# Patient Record
Sex: Male | Born: 1960 | Race: White | Hispanic: No | Marital: Married | Smoking: Never smoker
Health system: Southern US, Community
[De-identification: ages and names within clinical notes are randomized; demographics above are authoritative.]

## PROBLEM LIST (undated history)

## (undated) DIAGNOSIS — K219 Gastro-esophageal reflux disease without esophagitis: Secondary | ICD-10-CM

## (undated) HISTORY — PX: FINGER SURGERY: SHX640

---

## 2006-06-18 ENCOUNTER — Encounter: Payer: Self-pay | Admitting: *Deleted

## 2006-06-21 ENCOUNTER — Encounter: Admission: RE | Admit: 2006-06-21 | Discharge: 2006-06-21 | Payer: Self-pay | Admitting: *Deleted

## 2010-02-26 ENCOUNTER — Encounter: Payer: Self-pay | Admitting: *Deleted

## 2012-10-30 ENCOUNTER — Other Ambulatory Visit: Payer: Self-pay | Admitting: Neurosurgery

## 2012-10-30 DIAGNOSIS — M47816 Spondylosis without myelopathy or radiculopathy, lumbar region: Secondary | ICD-10-CM

## 2012-11-03 ENCOUNTER — Ambulatory Visit
Admission: RE | Admit: 2012-11-03 | Discharge: 2012-11-03 | Disposition: A | Discharge status: Home / Self Care | Payer: Worker's Compensation | Source: Ambulatory Visit | Attending: Neurosurgery | Admitting: Neurosurgery

## 2012-11-03 DIAGNOSIS — M47816 Spondylosis without myelopathy or radiculopathy, lumbar region: Secondary | ICD-10-CM

## 2012-11-03 MED ORDER — METHYLPREDNISOLONE ACETATE 40 MG/ML INJ SUSP (RADIOLOG
120.0000 mg | Freq: Once | INTRAMUSCULAR | Status: AC
  Administered 2012-11-03: 120 mg via EPIDURAL

## 2012-11-03 MED ORDER — IOHEXOL 180 MG/ML  SOLN
1.0000 mL | Freq: Once | INTRAMUSCULAR | Status: AC | PRN
  Administered 2012-11-03: 1 mL via EPIDURAL

## 2012-12-02 ENCOUNTER — Other Ambulatory Visit: Payer: Self-pay | Admitting: Neurosurgery

## 2012-12-02 DIAGNOSIS — M47816 Spondylosis without myelopathy or radiculopathy, lumbar region: Secondary | ICD-10-CM

## 2012-12-15 ENCOUNTER — Ambulatory Visit
Admission: RE | Admit: 2012-12-15 | Discharge: 2012-12-15 | Disposition: A | Discharge status: Home / Self Care | Payer: Worker's Compensation | Source: Ambulatory Visit | Attending: Neurosurgery | Admitting: Neurosurgery

## 2012-12-15 VITALS — BP 132/79 | HR 60

## 2012-12-15 DIAGNOSIS — M47816 Spondylosis without myelopathy or radiculopathy, lumbar region: Secondary | ICD-10-CM

## 2012-12-15 DIAGNOSIS — M5126 Other intervertebral disc displacement, lumbar region: Secondary | ICD-10-CM

## 2012-12-15 MED ORDER — METHYLPREDNISOLONE ACETATE 40 MG/ML INJ SUSP (RADIOLOG: 120.0000 mg | Freq: Once | INTRAMUSCULAR | Status: DC

## 2012-12-15 MED ORDER — IOHEXOL 180 MG/ML  SOLN: 1.0000 mL | Freq: Once | INTRAMUSCULAR | Status: AC | PRN

## 2015-10-26 ENCOUNTER — Other Ambulatory Visit (INDEPENDENT_AMBULATORY_CARE_PROVIDER_SITE_OTHER): Payer: Self-pay | Admitting: Internal Medicine

## 2015-10-26 DIAGNOSIS — R1310 Dysphagia, unspecified: Secondary | ICD-10-CM

## 2015-10-27 ENCOUNTER — Encounter (HOSPITAL_COMMUNITY): Payer: Self-pay | Admitting: *Deleted

## 2015-10-27 ENCOUNTER — Encounter (HOSPITAL_COMMUNITY)
Admission: RE | Disposition: A | Discharge status: Home / Self Care | Payer: Self-pay | Source: Ambulatory Visit | Attending: Internal Medicine

## 2015-10-27 ENCOUNTER — Ambulatory Visit (HOSPITAL_COMMUNITY)
Admission: RE | Admit: 2015-10-27 | Discharge: 2015-10-27 | Disposition: A | Discharge status: Home / Self Care | Payer: BLUE CROSS/BLUE SHIELD | Source: Ambulatory Visit | Attending: Internal Medicine | Admitting: Internal Medicine

## 2015-10-27 DIAGNOSIS — K449 Diaphragmatic hernia without obstruction or gangrene: Secondary | ICD-10-CM | POA: Diagnosis not present

## 2015-10-27 DIAGNOSIS — K2960 Other gastritis without bleeding: Secondary | ICD-10-CM | POA: Diagnosis not present

## 2015-10-27 DIAGNOSIS — K2970 Gastritis, unspecified, without bleeding: Secondary | ICD-10-CM | POA: Insufficient documentation

## 2015-10-27 DIAGNOSIS — K3189 Other diseases of stomach and duodenum: Secondary | ICD-10-CM | POA: Diagnosis not present

## 2015-10-27 DIAGNOSIS — R1314 Dysphagia, pharyngoesophageal phase: Secondary | ICD-10-CM

## 2015-10-27 DIAGNOSIS — K219 Gastro-esophageal reflux disease without esophagitis: Secondary | ICD-10-CM | POA: Diagnosis not present

## 2015-10-27 DIAGNOSIS — R1310 Dysphagia, unspecified: Secondary | ICD-10-CM | POA: Diagnosis not present

## 2015-10-27 HISTORY — PX: FOREIGN BODY REMOVAL: SHX962

## 2015-10-27 HISTORY — PX: ESOPHAGOGASTRODUODENOSCOPY: SHX5428

## 2015-10-27 HISTORY — DX: Gastro-esophageal reflux disease without esophagitis: K21.9

## 2015-10-27 HISTORY — PX: ESOPHAGEAL DILATION: SHX303

## 2015-10-27 SURGERY — EGD (ESOPHAGOGASTRODUODENOSCOPY)
Anesthesia: Moderate Sedation

## 2015-10-27 MED ORDER — MIDAZOLAM HCL 5 MG/5ML IJ SOLN
INTRAMUSCULAR | Status: AC
  Filled 2015-10-27: qty 10

## 2015-10-27 MED ORDER — MEPERIDINE HCL 50 MG/ML IJ SOLN
INTRAMUSCULAR | Status: AC
  Filled 2015-10-27: qty 1

## 2015-10-27 MED ORDER — MIDAZOLAM HCL 5 MG/5ML IJ SOLN
INTRAMUSCULAR | Status: DC | PRN
  Administered 2015-10-27 (×3): 2 mg via INTRAVENOUS
  Administered 2015-10-27: 1 mg via INTRAVENOUS
  Administered 2015-10-27: 2 mg via INTRAVENOUS
  Administered 2015-10-27: 1 mg via INTRAVENOUS

## 2015-10-27 MED ORDER — SODIUM CHLORIDE 0.9 % IV SOLN
INTRAVENOUS | Status: DC
  Administered 2015-10-27: 1000 mL via INTRAVENOUS

## 2015-10-27 MED ORDER — MEPERIDINE HCL 50 MG/ML IJ SOLN
INTRAMUSCULAR | Status: DC | PRN
  Administered 2015-10-27 (×2): 25 mg via INTRAVENOUS

## 2015-10-27 NOTE — H&P (Signed)
Calvin Delacruz is an 55 y.o. male.   Chief Complaint: Patient is here for EGD and possible ED. HPI: She is [age] old Caucasian male was at symptoms of GERD for more than 15 years who had difficulty swallowing and is eating up yesterday. It got stuck in suprasternal region. States she was finally able to wash it down. He has occasional difficulty with meats and breads. He says his symptoms started with lump in his throat. He was seen by ENT specialist and treated with Nexium 40 twice a day for several months and then he was taking once a day lately she's been using an on demand. He watches his diet. He denies weight loss melena or abdominal pain.  Past Medical History:  Diagnosis Date  . GERD (gastroesophageal reflux disease)     Past Surgical History:  Procedure Laterality Date  . FINGER SURGERY Right    and arm    Family History  Problem Relation Age of Onset  . GER disease Mother   . Heart attack Father   . GER disease Sister   . GER disease Brother    Social History:  reports that he has never smoked. He has never used smokeless tobacco. He reports that he drinks alcohol. He reports that he does not use drugs.  Allergies: No Known Allergies  Medications Prior to Admission  Medication Sig Dispense Refill  . esomeprazole (NEXIUM) 20 MG capsule Take 20 mg by mouth daily at 12 noon.      No results found for this or any previous visit (from the past 48 hour(s)). No results found.  ROS  Blood pressure (!) 143/88, pulse 66, temperature 98.4 F (36.9 C), temperature source Oral, resp. rate 19, height 5\' 6"  (1.676 m), weight 170 lb (77.1 kg), SpO2 100 %. Physical Exam  Constitutional: He appears well-developed and well-nourished.  HENT:  Mouth/Throat: Oropharynx is clear and moist.  Eyes: Conjunctivae are normal. No scleral icterus.  Neck: No thyromegaly present.  Cardiovascular: Normal rate, regular rhythm and normal heart sounds.   No murmur heard. Respiratory: Effort normal  and breath sounds normal.  GI: Soft. He exhibits no distension and no mass. There is no tenderness.  Musculoskeletal: He exhibits no edema.  Lymphadenopathy:    He has no cervical adenopathy.  Neurological: He is alert.  Skin: Skin is warm and dry.     Assessment/Plan Chronic GERD. Solid food dysphagia. EGD possible ED.  Lionel DecemberNajeeb Kenadie Royce, MD 10/27/2015, 10:59 AM

## 2015-10-27 NOTE — Op Note (Addendum)
[facility] Patient Name: Calvin Delacruz Procedure Date: 10/27/2015 9:06 AM MRN: [id number] Date of Birth: 1960/05/18 Attending MD: Lionel December , MD CSN: [id number] Age: 55 Admit Type: Outpatient Procedure:                Upper GI endoscopy Indications:              Esophageal dysphagia, Gastro-esophageal reflux                            disease Providers:                Lionel December, MD, Nena Polio, RN Referring MD:             Primary care physician: Estanislado Pandy, MD Medicines:                Cetacaine spray, Meperidine 50 mg IV, Midazolam 10                            mg IV Complications:            No immediate complications. Estimated Blood Loss:     Estimated blood loss was minimal. Procedure:                Pre-Anesthesia Assessment:                           - Prior to the procedure, a History and Physical                            was performed, and patient medications and                            allergies were reviewed. The patient's tolerance of                            previous anesthesia was also reviewed. The risks                            and benefits of the procedure and the sedation                            options and risks were discussed with the patient.                            All questions were answered, and informed consent                            was obtained. Prior Anticoagulants: The patient has                            taken no previous anticoagulant or antiplatelet                            agents. ASA Grade Assessment: I - A normal, healthy  patient. After reviewing the risks and benefits,                            the patient was deemed in satisfactory condition to                            undergo the procedure.                           After obtaining informed consent, the endoscope was                            passed under direct vision. Throughout the                            procedure, the  patient's blood pressure, pulse, and                            oxygen saturations were monitored continuously. The                            EG-[id number] ([id number]) scope was introduced through the                            and advanced to the second part of duodenum. The                            upper GI endoscopy was accomplished without                            difficulty. The patient tolerated the procedure                            well. Scope In: 11:14:29 AM Scope Out: 11:31:26 AM Total Procedure Duration: 0 hours 16 minutes 57 seconds  Findings:      The examined esophagus was normal.      The Z-line was regular and was found 40 cm from the incisors.      A 2 cm hiatal hernia was present.      No endoscopic abnormality was evident in the esophagus to explain the       patient's complaint of dysphagia. It was decided, however, to proceed       with dilation of the entire esophagus. The dilation site was examined       following endoscope reinsertion and showed no change and no bleeding,       mucosal tear or perforation.      A few localized, non-bleeding erosions were found in the prepyloric       region of the stomach. There were no stigmata of recent bleeding.       Biopsies were taken with a cold forceps for histology.      The exam of the stomach was otherwise normal.      A small amount of food (residue) was found in the gastric body.      The duodenal bulb and second portion of the duodenum were normal. Impression:               -  Normal esophagus.                           - Z-line regular, 40 cm from the incisors.                           - 2 cm hiatal hernia.                           - No endoscopic esophageal abnormality to explain                            patient's dysphagia. Esophagus dilated.                           - Non-bleeding erosive gastropathy. Biopsied.                           - A small amount of food (residue) in the stomach.                            - Normal duodenal bulb and second portion of the                            duodenum. Moderate Sedation:      Moderate (conscious) sedation was administered by the endoscopy nurse       and supervised by the endoscopist. The following parameters were       monitored: oxygen saturation, heart rate, blood pressure, CO2       capnography and response to care. Total physician intraservice time was       25 minutes. Recommendation:           - Patient has a contact number available for                            emergencies. The signs and symptoms of potential                            delayed complications were discussed with the                            patient. Return to normal activities tomorrow.                            Written discharge instructions were provided to the                            patient.                           - Resume previous diet today.                           - Continue present medications.                           -  Await pathology results. Procedure Code(s):        --- Professional ---                           [phone], Esophagogastroduodenoscopy, flexible,                            transoral; with biopsy, single or multiple                           99152, Moderate sedation services provided by the                            same physician or other qualified health care                            professional performing the diagnostic or                            therapeutic service that the sedation supports,                            requiring the presence of an independent trained                            observer to assist in the monitoring of the                            patient's level of consciousness and physiological                            status; initial 15 minutes of intraservice time,                            patient age 63 years or older                           [phone], Moderate sedation services; each additional                             15 minutes intraservice time Diagnosis Code(s):        --- Professional ---                           K44.9, Diaphragmatic hernia without obstruction or                            gangrene                           K31.89, Other diseases of stomach and duodenum                           R13.14, Dysphagia, pharyngoesophageal phase  K21.9, Gastro-esophageal reflux disease without                            esophagitis CPT copyright 2016 American Medical Association. All rights reserved. The codes documented in this report are preliminary and upon coder review may  be revised to meet current compliance requirements. Lionel December, MD Lionel December, MD 10/27/2015 11:46:51 AM This report has been signed electronically. Number of Addenda: 1 Addendum Number: 1   Addendum Date: 11/07/2015 10:27:17 AM      Esophagus was dilated by passing 54 Fr. Maloney dilator.      Esophagus wa and no mucosal disruption noted. Lionel December, MD Lionel December, MD 11/07/2015 10:29:11 AM This report has been signed electronically.

## 2015-10-27 NOTE — Discharge Instructions (Signed)
Resume usual medications and diet. No driving for 24 hours. Physician will call with biopsy results.  Esophagogastroduodenoscopy, Care After Refer to this sheet in the next few weeks. These instructions provide you with information about caring for yourself after your procedure. Your health care provider may also give you more specific instructions. Your treatment has been planned according to current medical practices, but problems sometimes occur. Call your health care provider if you have any problems or questions after your procedure. Dr Karilyn Cotaehman [phone] WHAT TO EXPECT AFTER THE PROCEDURE After your procedure, it is typical to feel:  Soreness in your throat.  Pain with swallowing.  Sick to your stomach (nauseous).  Bloated.  Dizzy.  Fatigued. HOME CARE INSTRUCTIONS  Do not eat or drink anything until the numbing medicine (local anesthetic) has worn off and your gag reflex has returned. You will know that the local anesthetic has worn off when you can swallow comfortably.  Do not drive or operate machinery until directed by your health care provider.  Take medicines only as directed by your health care provider. SEEK MEDICAL CARE IF:   You cannot stop coughing.  You are not urinating at all or less than usual. SEEK IMMEDIATE MEDICAL CARE IF:  You have difficulty swallowing.  You cannot eat or drink.  You have worsening throat or chest pain.  You have dizziness or lightheadedness or you faint.  You have nausea or vomiting.  You have chills.  You have a fever.  You have severe abdominal pain.  You have black, tarry, or bloody stools.   This information is not intended to replace advice given to you by your health care provider. Make sure you discuss any questions you have with your health care provider.   Document Released: 01/09/2012 Document Revised: 02/12/2014 Document Reviewed: 01/09/2012 Elsevier Interactive Patient Education [employer]2016 Elsevier Inc.

## 2015-11-08 ENCOUNTER — Encounter (HOSPITAL_COMMUNITY): Payer: Self-pay | Admitting: Internal Medicine

## 2016-03-02 ENCOUNTER — Other Ambulatory Visit: Payer: Self-pay | Admitting: Neurosurgery

## 2016-03-02 DIAGNOSIS — M5412 Radiculopathy, cervical region: Secondary | ICD-10-CM

## 2016-03-04 ENCOUNTER — Ambulatory Visit
Admission: RE | Admit: 2016-03-04 | Discharge: 2016-03-04 | Disposition: A | Discharge status: Home / Self Care | Payer: Worker's Compensation | Source: Ambulatory Visit | Attending: Neurosurgery | Admitting: Neurosurgery

## 2016-03-04 DIAGNOSIS — M5412 Radiculopathy, cervical region: Secondary | ICD-10-CM

## 2016-03-05 ENCOUNTER — Other Ambulatory Visit: Payer: Self-pay | Admitting: Neurosurgery

## 2016-03-05 DIAGNOSIS — M5020 Other cervical disc displacement, unspecified cervical region: Secondary | ICD-10-CM

## 2016-03-06 ENCOUNTER — Ambulatory Visit
Admission: RE | Admit: 2016-03-06 | Discharge: 2016-03-06 | Disposition: A | Discharge status: Home / Self Care | Payer: Worker's Compensation | Source: Ambulatory Visit | Attending: Neurosurgery | Admitting: Neurosurgery

## 2016-03-06 DIAGNOSIS — M5020 Other cervical disc displacement, unspecified cervical region: Secondary | ICD-10-CM

## 2016-03-06 MED ORDER — TRIAMCINOLONE ACETONIDE 40 MG/ML IJ SUSP (RADIOLOGY)
60.0000 mg | Freq: Once | INTRAMUSCULAR | Status: AC
  Administered 2016-03-06: 60 mg via EPIDURAL

## 2016-03-06 MED ORDER — IOPAMIDOL (ISOVUE-M 300) INJECTION 61%
1.0000 mL | Freq: Once | INTRAMUSCULAR | Status: AC | PRN
  Administered 2016-03-06: 1 mL via EPIDURAL

## 2016-03-06 NOTE — Discharge Instructions (Signed)

## 2016-03-22 ENCOUNTER — Other Ambulatory Visit: Payer: Self-pay | Admitting: Neurosurgery

## 2016-03-22 DIAGNOSIS — M5020 Other cervical disc displacement, unspecified cervical region: Secondary | ICD-10-CM

## 2016-03-27 ENCOUNTER — Ambulatory Visit
Admission: RE | Admit: 2016-03-27 | Discharge: 2016-03-27 | Disposition: A | Discharge status: Home / Self Care | Payer: Worker's Compensation | Source: Ambulatory Visit | Attending: Neurosurgery | Admitting: Neurosurgery

## 2016-03-27 DIAGNOSIS — M5020 Other cervical disc displacement, unspecified cervical region: Secondary | ICD-10-CM

## 2016-03-27 MED ORDER — TRIAMCINOLONE ACETONIDE 40 MG/ML IJ SUSP (RADIOLOGY)
60.0000 mg | Freq: Once | INTRAMUSCULAR | Status: AC
  Administered 2016-03-27: 60 mg via EPIDURAL

## 2016-03-27 MED ORDER — IOPAMIDOL (ISOVUE-M 300) INJECTION 61%
1.0000 mL | Freq: Once | INTRAMUSCULAR | Status: AC | PRN
  Administered 2016-03-27: 1 mL via EPIDURAL

## 2016-04-11 ENCOUNTER — Other Ambulatory Visit (INDEPENDENT_AMBULATORY_CARE_PROVIDER_SITE_OTHER): Payer: Self-pay | Admitting: Internal Medicine

## 2016-04-11 DIAGNOSIS — R7989 Other specified abnormal findings of blood chemistry: Secondary | ICD-10-CM

## 2016-04-11 DIAGNOSIS — K759 Inflammatory liver disease, unspecified: Secondary | ICD-10-CM | POA: Diagnosis not present

## 2016-04-11 DIAGNOSIS — R51 Headache: Secondary | ICD-10-CM | POA: Diagnosis not present

## 2016-04-11 DIAGNOSIS — R112 Nausea with vomiting, unspecified: Secondary | ICD-10-CM

## 2016-04-11 DIAGNOSIS — R740 Nonspecific elevation of levels of transaminase and lactic acid dehydrogenase [LDH]: Secondary | ICD-10-CM | POA: Diagnosis not present

## 2016-04-11 DIAGNOSIS — R945 Abnormal results of liver function studies: Secondary | ICD-10-CM

## 2016-04-11 DIAGNOSIS — D72825 Bandemia: Secondary | ICD-10-CM | POA: Diagnosis not present

## 2016-04-13 ENCOUNTER — Other Ambulatory Visit (HOSPITAL_COMMUNITY)
Admission: RE | Admit: 2016-04-13 | Discharge: 2016-04-13 | Disposition: A | Discharge status: Home / Self Care | Payer: BLUE CROSS/BLUE SHIELD | Source: Ambulatory Visit | Attending: Internal Medicine | Admitting: Internal Medicine

## 2016-04-13 ENCOUNTER — Ambulatory Visit (HOSPITAL_COMMUNITY)
Admission: RE | Admit: 2016-04-13 | Discharge: 2016-04-13 | Disposition: A | Discharge status: Home / Self Care | Payer: BLUE CROSS/BLUE SHIELD | Source: Ambulatory Visit | Attending: Internal Medicine | Admitting: Internal Medicine

## 2016-04-13 DIAGNOSIS — R112 Nausea with vomiting, unspecified: Secondary | ICD-10-CM | POA: Insufficient documentation

## 2016-04-13 DIAGNOSIS — R7989 Other specified abnormal findings of blood chemistry: Secondary | ICD-10-CM | POA: Insufficient documentation

## 2016-04-13 DIAGNOSIS — R945 Abnormal results of liver function studies: Secondary | ICD-10-CM | POA: Diagnosis not present

## 2016-04-13 LAB — HEPATIC FUNCTION PANEL
ALK PHOS: 174 U/L — AB (ref 38–126)
ALT: 200 U/L — ABNORMAL HIGH (ref 17–63)
AST: 73 U/L — ABNORMAL HIGH (ref 15–41)
Albumin: 3.6 g/dL (ref 3.5–5.0)
BILIRUBIN INDIRECT: 1.5 mg/dL — AB (ref 0.3–0.9)
BILIRUBIN TOTAL: 2.1 mg/dL — AB (ref 0.3–1.2)
Bilirubin, Direct: 0.6 mg/dL — ABNORMAL HIGH (ref 0.1–0.5)
TOTAL PROTEIN: 7 g/dL (ref 6.5–8.1)

## 2016-04-14 LAB — CBC WITH DIFFERENTIAL/PLATELET
Basophils Absolute: 0 10*3/uL (ref 0.0–0.1)
Basophils Relative: 1 %
EOS PCT: 7 %
Eosinophils Absolute: 0.2 10*3/uL (ref 0.0–0.7)
HCT: 39.9 % (ref 39.0–52.0)
Hemoglobin: 13.7 g/dL (ref 13.0–17.0)
LYMPHS ABS: 0.8 10*3/uL (ref 0.7–4.0)
LYMPHS PCT: 22 %
MCH: 30.5 pg (ref 26.0–34.0)
MCHC: 34.3 g/dL (ref 30.0–36.0)
MCV: 88.9 fL (ref 78.0–100.0)
Monocytes Absolute: 0.3 10*3/uL (ref 0.1–1.0)
Monocytes Relative: 9 %
NEUTROS ABS: 2.2 10*3/uL (ref 1.7–7.7)
Neutrophils Relative %: 61 %
PLATELETS: 165 10*3/uL (ref 150–400)
RBC: 4.49 MIL/uL (ref 4.22–5.81)
RDW: 13.9 % (ref 11.5–15.5)
WBC: 3.6 10*3/uL — AB (ref 4.0–10.5)

## 2016-04-14 LAB — HEPATITIS B SURFACE ANTIBODY,QUALITATIVE: Hep B S Ab: NONREACTIVE

## 2016-04-14 LAB — HCV COMMENT:

## 2016-04-14 LAB — HEPATITIS A ANTIBODY, TOTAL: HEP A TOTAL AB: NEGATIVE

## 2016-04-14 LAB — HEPATITIS C ANTIBODY (REFLEX)

## 2016-04-14 LAB — HEPATITIS B CORE ANTIBODY, IGM: Hep B C IgM: NEGATIVE

## 2016-04-14 LAB — HEPATITIS B SURFACE ANTIGEN: Hepatitis B Surface Ag: NEGATIVE

## 2016-04-16 ENCOUNTER — Encounter (INDEPENDENT_AMBULATORY_CARE_PROVIDER_SITE_OTHER): Payer: Self-pay | Admitting: *Deleted

## 2016-04-16 ENCOUNTER — Other Ambulatory Visit (INDEPENDENT_AMBULATORY_CARE_PROVIDER_SITE_OTHER): Payer: Self-pay | Admitting: *Deleted

## 2016-04-16 DIAGNOSIS — D72819 Decreased white blood cell count, unspecified: Secondary | ICD-10-CM

## 2016-04-17 ENCOUNTER — Encounter (INDEPENDENT_AMBULATORY_CARE_PROVIDER_SITE_OTHER): Payer: Self-pay

## 2016-04-20 ENCOUNTER — Other Ambulatory Visit (INDEPENDENT_AMBULATORY_CARE_PROVIDER_SITE_OTHER): Payer: Self-pay | Admitting: *Deleted

## 2016-04-20 ENCOUNTER — Other Ambulatory Visit (HOSPITAL_COMMUNITY)
Admission: RE | Admit: 2016-04-20 | Discharge: 2016-04-20 | Disposition: A | Discharge status: Home / Self Care | Payer: BLUE CROSS/BLUE SHIELD | Source: Ambulatory Visit | Attending: Internal Medicine | Admitting: Internal Medicine

## 2016-04-20 DIAGNOSIS — R600 Localized edema: Secondary | ICD-10-CM

## 2016-04-20 DIAGNOSIS — R609 Edema, unspecified: Secondary | ICD-10-CM | POA: Insufficient documentation

## 2016-04-20 DIAGNOSIS — D72819 Decreased white blood cell count, unspecified: Secondary | ICD-10-CM

## 2016-04-20 LAB — CBC
HEMATOCRIT: 36 % — AB (ref 39.0–52.0)
Hemoglobin: 12.3 g/dL — ABNORMAL LOW (ref 13.0–17.0)
MCH: 30.4 pg (ref 26.0–34.0)
MCHC: 34.2 g/dL (ref 30.0–36.0)
MCV: 88.9 fL (ref 78.0–100.0)
PLATELETS: 263 10*3/uL (ref 150–400)
RBC: 4.05 MIL/uL — ABNORMAL LOW (ref 4.22–5.81)
RDW: 13.8 % (ref 11.5–15.5)
WBC: 5.6 10*3/uL (ref 4.0–10.5)

## 2016-04-20 LAB — HEPATIC FUNCTION PANEL
ALK PHOS: 118 U/L (ref 38–126)
ALT: 131 U/L — ABNORMAL HIGH (ref 17–63)
AST: 31 U/L (ref 15–41)
Albumin: 3.7 g/dL (ref 3.5–5.0)
BILIRUBIN INDIRECT: 0.5 mg/dL (ref 0.3–0.9)
BILIRUBIN TOTAL: 0.6 mg/dL (ref 0.3–1.2)
Bilirubin, Direct: 0.1 mg/dL (ref 0.1–0.5)
TOTAL PROTEIN: 6.5 g/dL (ref 6.5–8.1)

## 2016-04-20 LAB — MONONUCLEOSIS SCREEN: MONO SCREEN: NEGATIVE

## 2016-04-20 LAB — SEDIMENTATION RATE: Sed Rate: 8 mm/hr (ref 0–16)

## 2016-04-23 ENCOUNTER — Other Ambulatory Visit (INDEPENDENT_AMBULATORY_CARE_PROVIDER_SITE_OTHER): Payer: Self-pay | Admitting: *Deleted

## 2016-04-23 DIAGNOSIS — D72819 Decreased white blood cell count, unspecified: Secondary | ICD-10-CM

## 2016-04-24 DIAGNOSIS — R7989 Other specified abnormal findings of blood chemistry: Secondary | ICD-10-CM | POA: Diagnosis not present

## 2016-04-24 DIAGNOSIS — Z6828 Body mass index (BMI) 28.0-28.9, adult: Secondary | ICD-10-CM | POA: Diagnosis not present

## 2016-04-24 DIAGNOSIS — R600 Localized edema: Secondary | ICD-10-CM | POA: Diagnosis not present

## 2016-04-27 DIAGNOSIS — R0602 Shortness of breath: Secondary | ICD-10-CM | POA: Diagnosis not present

## 2016-04-27 DIAGNOSIS — I517 Cardiomegaly: Secondary | ICD-10-CM | POA: Diagnosis not present

## 2016-04-27 DIAGNOSIS — R609 Edema, unspecified: Secondary | ICD-10-CM | POA: Diagnosis not present

## 2016-04-30 ENCOUNTER — Encounter: Payer: Self-pay | Admitting: Cardiology

## 2016-04-30 ENCOUNTER — Ambulatory Visit (INDEPENDENT_AMBULATORY_CARE_PROVIDER_SITE_OTHER): Payer: BLUE CROSS/BLUE SHIELD | Admitting: Cardiology

## 2016-04-30 VITALS — BP 134/86 | HR 45 | Ht 67.0 in | Wt 188.0 lb

## 2016-04-30 DIAGNOSIS — I517 Cardiomegaly: Secondary | ICD-10-CM

## 2016-04-30 DIAGNOSIS — Z8619 Personal history of other infectious and parasitic diseases: Secondary | ICD-10-CM | POA: Diagnosis not present

## 2016-04-30 DIAGNOSIS — K219 Gastro-esophageal reflux disease without esophagitis: Secondary | ICD-10-CM

## 2016-04-30 DIAGNOSIS — R0789 Other chest pain: Secondary | ICD-10-CM | POA: Diagnosis not present

## 2016-04-30 DIAGNOSIS — R0602 Shortness of breath: Secondary | ICD-10-CM | POA: Diagnosis not present

## 2016-04-30 NOTE — Progress Notes (Signed)
Cardiology Office Note  Date: 04/30/2016   ID: Calvin Delacruz, DOB October 31, 1960, MRN [id number]  PCP: Estanislado PandySASSER,PAUL W, MD  Consulting Cardiologist: Nona DellSamuel Kaliopi Blyden, MD   Chief Complaint  Patient presents with  . Cardiomegaly    History of Present Illness: Calvin Delacruz is a 56 y.o. male referred for cardiology consultation by Dr. Neita CarpSasser. He is here today with his wife. States approximately one month ago he was diagnosed with hepatitis, possibly medication related (was taking gabapentin and tramadol at a time), worked up by Dr. Karilyn Cotaehman with negative viral serologies and unremarkable abdominal ultrasound. Over the last few weeks he has had a reduction in LFTs, saw his PCP last week with repeat lab work. Complains of a feeling of relative shortness of breath and also achiness in his chest, somewhat positional and sore to touch. He has had no recent fevers or chills. Description of possible orthopnea as well. Reports leg edema that has been transient. He has been drinking more fluids lately trying to "flush things out." Chest x-ray was obtained by PCP with description of cardiomegaly and pulmonary venous prominence. He has no history of cardiomyopathy.  I reviewed his current medications. He is now only using Nexium for reflux. No progressive symptoms. He underwent an EGD in September of last year.  He has not undergone any prior cardiac testing.  Past Medical History:  Diagnosis Date  . GERD (gastroesophageal reflux disease)     Past Surgical History:  Procedure Laterality Date  . ESOPHAGEAL DILATION N/A 10/27/2015   Procedure: ESOPHAGEAL DILATION;  Surgeon: Malissa HippoNajeeb U Rehman, MD;  [facility];  Service: Endoscopy;  Laterality: N/A;  . ESOPHAGOGASTRODUODENOSCOPY N/A 10/27/2015   Procedure: ESOPHAGOGASTRODUODENOSCOPY (EGD);  Surgeon: Malissa HippoNajeeb U Rehman, MD;  [facility];  Service: Endoscopy;  Laterality: N/A;  . FINGER SURGERY Right    and arm  . FOREIGN BODY REMOVAL  N/A 10/27/2015   Procedure: FOREIGN BODY REMOVAL;  Surgeon: Malissa HippoNajeeb U Rehman, MD;  [facility]: [facility];  Service: Endoscopy;  Laterality: N/A;    Current Outpatient Prescriptions  Medication Sig Dispense Refill  . esomeprazole (NEXIUM) 20 MG capsule Take 20 mg by mouth daily at 12 noon.     No current facility-administered medications for this visit.    Allergies:  Patient has no known allergies.   Social History: The patient  reports that he has never smoked. He has never used smokeless tobacco. He reports that he drinks alcohol. He reports that he does not use drugs.   Family History: The patient's family history includes CAD in his brother; GER disease in his brother, mother, and sister; Heart attack in his father; Hypertension in his mother and sister.   ROS:  Please see the history of present illness. Otherwise, complete review of systems is positive for none.  All other systems are reviewed and negative.   Physical Exam: VS:  BP 134/86 (BP Location: Left Arm)   Pulse (!) 45   Ht 5\' 7"  (1.702 m)   Wt 188 lb (85.3 kg)   SpO2 98%   BMI 29.44 kg/m , BMI Body mass index is 29.44 kg/m.  Wt Readings from Last 3 Encounters:  04/30/16 188 lb (85.3 kg)  10/27/15 170 lb (77.1 kg)    General: Patient appears comfortable at rest. HEENT: Conjunctiva and lids normal, oropharynx clear. Neck: Supple, no elevated JVP or carotid bruits, no thyromegaly. Lungs: Clear to auscultation, nonlabored breathing at rest. Cardiac: Regular rate and rhythm,  no S3 or significant systolic murmur, no pericardial rub. Abdomen: Soft, nontender, bowel sounds present, no guarding or rebound. Extremities: No pitting edema, distal pulses 2+. Skin: Warm and dry. Musculoskeletal: No kyphosis. Neuropsychiatric: Alert and oriented x3, affect grossly appropriate.  ECG: No old tracing available for comparison.  Recent Labwork: 04/20/2016: ALT 131; AST 31; Hemoglobin 12.3; Platelets 263  March 2018: Urine  albumin 3.5, BUN 12, creatinine 0.7, potassium 4.2, AST 22, ALT 64, hemoglobin 12.1, platelets 246  Other Studies Reviewed Today:  Abdominal ultrasound 04/13/2016: FINDINGS: Gallbladder: No gallstones or wall thickening visualized. No sonographic Murphy sign noted by sonographer.  Common bile duct: Diameter: 3 mm  Liver: No focal lesion identified. Within normal limits in parenchymal echogenicity. No convincing surface nodularity.  IVC: No abnormality visualized.  Pancreas: Visualized portion unremarkable.  Spleen: Size and appearance within normal limits.  Right Kidney: Length: 12 cm. Echogenicity within normal limits. No solid mass or hydronephrosis visualized. 13 mm simple appearing cysts.  Left Kidney: Length: 12 cm. Echogenicity within normal limits. No mass or hydronephrosis visualized.  Abdominal aorta: No aneurysm visualized.  Other findings: None.  IMPRESSION: No explanation for symptoms.  Chest x-ray 04/27/2016 [facility]): Cardiomegaly with mild bilateral pulmonary venous prominence, low lung volumes, no infiltrates.  Assessment and Plan:  1. Cardiomegaly noted by recent chest x-ray. This may not be indicative of frank cardiomyopathy, however with his recent reported shortness of breath, atypical chest discomfort, and intermittent leg edema, echocardiogram is indicated for more definitive assessment of cardiac structure and function. This will be arranged as well as an office follow-up visit to see if we need to pursue any further ischemic workup.  2. Recent episode of hepatitis, possibly medication related. He is following with Dr. Karilyn Cota. Recent follow-up LFTs show improvement.  3. History of reflux, currently on Nexium. Last EGD was in September 2017.  Current medicines were reviewed with the patient today.   Orders Placed This Encounter  Procedures  . ECHOCARDIOGRAM COMPLETE    Disposition: Follow-up in the next few weeks.  Signed, Jonelle Sidle, MD, [facility] 04/30/2016 4:26 PM    [facility] at [facility] [address] [place], [place] [address] Phone: [phone]; Fax: [phone]

## 2016-04-30 NOTE — Patient Instructions (Signed)
Your physician recommends that you schedule a follow-up appointment in:  3 weeks Dr. Diona BrownerMcDowell    Your physician has requested that you have an echocardiogram. Echocardiography is a painless test that uses sound waves to create images of your heart. It provides your doctor with information about the size and shape of your heart and how well your heart's chambers and valves are working. This procedure takes approximately one hour. There are no restrictions for this procedure.       Thank you for choosing [facility] !

## 2016-05-02 ENCOUNTER — Ambulatory Visit (HOSPITAL_COMMUNITY)
Admission: RE | Admit: 2016-05-02 | Discharge: 2016-05-02 | Disposition: A | Discharge status: Home / Self Care | Payer: BLUE CROSS/BLUE SHIELD | Source: Ambulatory Visit | Attending: Cardiology | Admitting: Cardiology

## 2016-05-02 DIAGNOSIS — I517 Cardiomegaly: Secondary | ICD-10-CM

## 2016-05-02 DIAGNOSIS — K219 Gastro-esophageal reflux disease without esophagitis: Secondary | ICD-10-CM | POA: Diagnosis not present

## 2016-05-02 DIAGNOSIS — R0602 Shortness of breath: Secondary | ICD-10-CM

## 2016-05-02 LAB — ECHOCARDIOGRAM COMPLETE
E/e' ratio: 7.25
EWDT: 187 ms
FS: 40 % (ref 28–44)
IV/PV OW: 0.98
LA vol index: 26.1 mL/m2
LA vol: 53 mL
LADIAMINDEX: 1.92 cm/m2
LASIZE: 39 mm
LAVOLA4C: 54.5 mL
LDCA: 3.14 cm2
LEFT ATRIUM END SYS DIAM: 39 mm
LV E/e' medial: 7.25
LV E/e'average: 7.25
LV PW d: 10.7 mm — AB (ref 0.6–1.1)
LV TDI E'LATERAL: 12.7
LV TDI E'MEDIAL: 8.38
LV dias vol index: 45 mL/m2
LV e' LATERAL: 12.7 cm/s
LVDIAVOL: 92 mL (ref 62–150)
LVOT SV: 78 mL
LVOT VTI: 24.7 cm
LVOT peak grad rest: 6 mmHg
LVOT peak vel: 122 cm/s
LVOTD: 20 mm
LVSYSVOL: 32 mL (ref 21–61)
LVSYSVOLIN: 16 mL/m2
MV Dec: 187
MV pk A vel: 68.1 m/s
MVPG: 3 mmHg
MVPKEVEL: 92.1 m/s
RV LATERAL S' VELOCITY: 14.1 cm/s
RV TAPSE: 20 mm
Simpson's disk: 66
Stroke v: 61 ml

## 2016-05-02 NOTE — Progress Notes (Signed)
*  PRELIMINARY RESULTS* Echocardiogram 2D Echocardiogram has been performed.  Stacey DrainWhite, Lular Letson J 05/02/2016, 3:35 PM

## 2016-05-09 ENCOUNTER — Other Ambulatory Visit (INDEPENDENT_AMBULATORY_CARE_PROVIDER_SITE_OTHER): Payer: Self-pay | Admitting: *Deleted

## 2016-05-09 ENCOUNTER — Encounter (INDEPENDENT_AMBULATORY_CARE_PROVIDER_SITE_OTHER): Payer: Self-pay | Admitting: *Deleted

## 2016-05-09 DIAGNOSIS — R748 Abnormal levels of other serum enzymes: Secondary | ICD-10-CM

## 2016-05-09 DIAGNOSIS — D649 Anemia, unspecified: Secondary | ICD-10-CM

## 2016-05-22 NOTE — Progress Notes (Signed)
Cardiology Office Note  Date: 05/23/2016   ID: Calvin Delacruz, DOB 1960/06/20, MRN [id number]  PCP: Estanislado Pandy, MD  Primary Cardiologist: Nona Dell, MD   Chief Complaint  Patient presents with  . Follow-up testing    History of Present Illness: Calvin Delacruz is a 56 y.o. male see him consultation in March. He presents with his wife for a scheduled follow-up. Doing well, no feeling of chest fullness or shortness of breath, no abdominal pain. States that he is having follow-up lab work per Dr. Karilyn Cota.  Echocardiogram from March is outlined below, LVEF normal range. I discussed the results with him. At this point no further cardiac testing is anticipated.  Past Medical History:  Diagnosis Date  . GERD (gastroesophageal reflux disease)     Past Surgical History:  Procedure Laterality Date  . ESOPHAGEAL DILATION N/A 10/27/2015   Procedure: ESOPHAGEAL DILATION;  Surgeon: Malissa Hippo, MD;  [facility];  Service: Endoscopy;  Laterality: N/A;  . ESOPHAGOGASTRODUODENOSCOPY N/A 10/27/2015   Procedure: ESOPHAGOGASTRODUODENOSCOPY (EGD);  Surgeon: Malissa Hippo, MD;  [facility];  Service: Endoscopy;  Laterality: N/A;  . FINGER SURGERY Right    and arm  . FOREIGN BODY REMOVAL N/A 10/27/2015   Procedure: FOREIGN BODY REMOVAL;  Surgeon: Malissa Hippo, MD;  [facility];  Service: Endoscopy;  Laterality: N/A;    Current Outpatient Prescriptions  Medication Sig Dispense Refill  . esomeprazole (NEXIUM) 20 MG capsule Take 20 mg by mouth daily at 12 noon.     No current facility-administered medications for this visit.    Allergies:  Patient has no known allergies.   Social History: The patient  reports that he has never smoked. He has never used smokeless tobacco. He reports that he drinks alcohol. He reports that he does not use drugs.   ROS:  Please see the history of present illness. Otherwise, complete review of systems is positive  for none.  All other systems are reviewed and negative.   Physical Exam: VS:  BP 118/82   Pulse (!) 54   Ht  (1.702 m)   Wt 190 lb (86.2 kg)   SpO2 94%   BMI 29.76 kg/m , BMI Body mass index is 29.76 kg/m.  Wt Readings from Last 3 Encounters:  05/23/16 190 lb (86.2 kg)  04/30/16 188 lb (85.3 kg)  10/27/15 170 lb (77.1 kg)    General: Patient appears comfortable at rest. HEENT: Conjunctiva and lids normal, oropharynx clear. Neck: Supple, no elevated JVP or carotid bruits, no thyromegaly. Lungs: Clear to auscultation, nonlabored breathing at rest. Cardiac: Regular rate and rhythm, no S3 or significant systolic murmur, no pericardial rub. Abdomen: Soft, nontender, bowel sounds present, no guarding or rebound. Extremities: No pitting edema, distal pulses 2+.  ECG: No old tracing available for comparison.  Recent Labwork: 04/20/2016: ALT 131; AST 31; Hemoglobin 12.3; Platelets 263  March 2018: Urine albumin 3.5, BUN 12, creatinine 0.7, potassium 4.2, AST 22, ALT 64, hemoglobin 12.1, platelets 246  Other Studies Reviewed Today:  Echocardiogram 05/02/2016: Study Conclusions  - Left ventricle: The cavity size was normal. Wall thickness was   increased in a pattern of mild LVH. Systolic function was normal.   The estimated ejection fraction was in the range of 55% to 60%.   Wall motion was normal; there were no regional wall motion   abnormalities. Left ventricular diastolic function parameters   were normal. - Aortic valve: Mildly  calcified annulus. Trileaflet; normal   thickness leaflets. Valve area (VTI): 3.3 cm^2. Valve area   (Vmax): 3.3 cm^2. Valve area (Vmean): 2.88 cm^2. - Technically adequate study.  Assessment and Plan:  1. Question of cardiomegaly by prior chest x-ray although echocardiogram shows normal heart size and LVEF. He has mild LVH but normal diastolic function. At this point no further cardiac testing is planned. Would continue follow-up with PCP for  basic risk factor modification and follow-up of blood pressure over time.  2. GERD, continues on Nexium.  Current medicines were reviewed with the patient today.   Orders Placed This Encounter  Procedures  . EKG 12-Lead    Disposition: Follow-up as needed.  Signed, Jonelle Sidle, MD, [facility] 05/23/2016 1:20 PM    [facility] at [facility] [address] [place], [place] [address] Phone: [phone]; Fax: [phone]

## 2016-05-23 ENCOUNTER — Encounter: Payer: Self-pay | Admitting: Cardiology

## 2016-05-23 ENCOUNTER — Ambulatory Visit (INDEPENDENT_AMBULATORY_CARE_PROVIDER_SITE_OTHER): Payer: BLUE CROSS/BLUE SHIELD | Admitting: Cardiology

## 2016-05-23 VITALS — BP 118/82 | HR 54 | Ht 67.0 in | Wt 190.0 lb

## 2016-05-23 DIAGNOSIS — D72819 Decreased white blood cell count, unspecified: Secondary | ICD-10-CM | POA: Diagnosis not present

## 2016-05-23 DIAGNOSIS — K219 Gastro-esophageal reflux disease without esophagitis: Secondary | ICD-10-CM | POA: Diagnosis not present

## 2016-05-23 DIAGNOSIS — I517 Cardiomegaly: Secondary | ICD-10-CM | POA: Diagnosis not present

## 2016-05-23 DIAGNOSIS — R600 Localized edema: Secondary | ICD-10-CM | POA: Diagnosis not present

## 2016-05-23 LAB — HEPATIC FUNCTION PANEL
ALK PHOS: 73 U/L (ref 40–115)
ALT: 28 U/L (ref 9–46)
AST: 22 U/L (ref 10–35)
Albumin: 4.3 g/dL (ref 3.6–5.1)
BILIRUBIN INDIRECT: 0.8 mg/dL (ref 0.2–1.2)
BILIRUBIN TOTAL: 0.9 mg/dL (ref 0.2–1.2)
Bilirubin, Direct: 0.1 mg/dL (ref <=0.2)
TOTAL PROTEIN: 6.6 g/dL (ref 6.1–8.1)

## 2016-05-23 LAB — CBC
HCT: 42.6 % (ref 38.5–50.0)
Hemoglobin: 14.5 g/dL (ref 13.2–17.1)
MCH: 30 pg (ref 27.0–33.0)
MCHC: 34 g/dL (ref 32.0–36.0)
MCV: 88.2 fL (ref 80.0–100.0)
MPV: 11.1 fL (ref 7.5–12.5)
Platelets: 191 10*3/uL (ref 140–400)
RBC: 4.83 MIL/uL (ref 4.20–5.80)
RDW: 14.2 % (ref 11.0–15.0)
WBC: 6.2 10*3/uL (ref 3.8–10.8)

## 2016-05-23 NOTE — Patient Instructions (Signed)
Your physician recommends that you schedule a follow-up appointment in: as needed     Your physician recommends that you continue on your current medications as directed. Please refer to the Current Medication list given to you today.   Thank you for choosing [facility] !         

## 2016-05-24 LAB — SEDIMENTATION RATE: Sed Rate: 1 mm/hr (ref 0–20)

## 2016-05-24 LAB — MONONUCLEOSIS SCREEN: HETEROPHILE, MONO SCREEN: NEGATIVE

## 2016-06-21 ENCOUNTER — Encounter (INDEPENDENT_AMBULATORY_CARE_PROVIDER_SITE_OTHER): Payer: Self-pay

## 2017-09-23 DIAGNOSIS — Z6827 Body mass index (BMI) 27.0-27.9, adult: Secondary | ICD-10-CM | POA: Diagnosis not present

## 2017-09-23 DIAGNOSIS — J0101 Acute recurrent maxillary sinusitis: Secondary | ICD-10-CM | POA: Diagnosis not present

## 2018-11-01 IMAGING — XA DG INJECT/[PERSON_NAME] INC NEEDLE/CATH/PLC EPI/CERV/THOR W/IMG
2 series · 2 of 2 positions shown · non-contrast
Comparison: none

CLINICAL DATA: Left-sided disc herniation. Spondylosis without
myelopathy. Left arm and hand pain and numbness. Symptoms improved
from the initial injection but persistent to some degree.

[Series 1: ortho standard · 1 of 1 slices shown (1 of 2)]
[im 1/1]
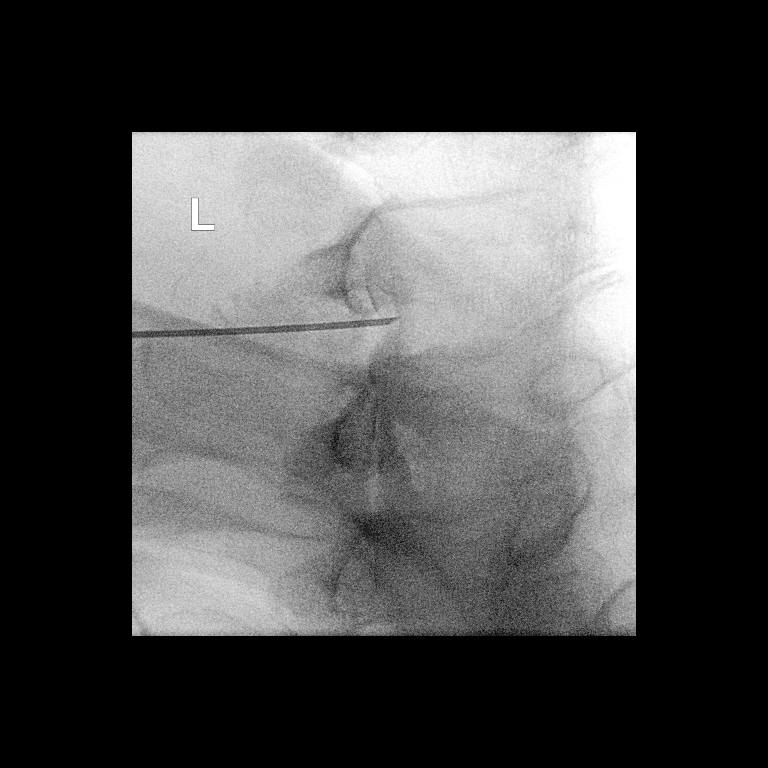

[Series 2: ortho standard · 1 of 1 slices shown (2 of 2)]
[im 1/1]
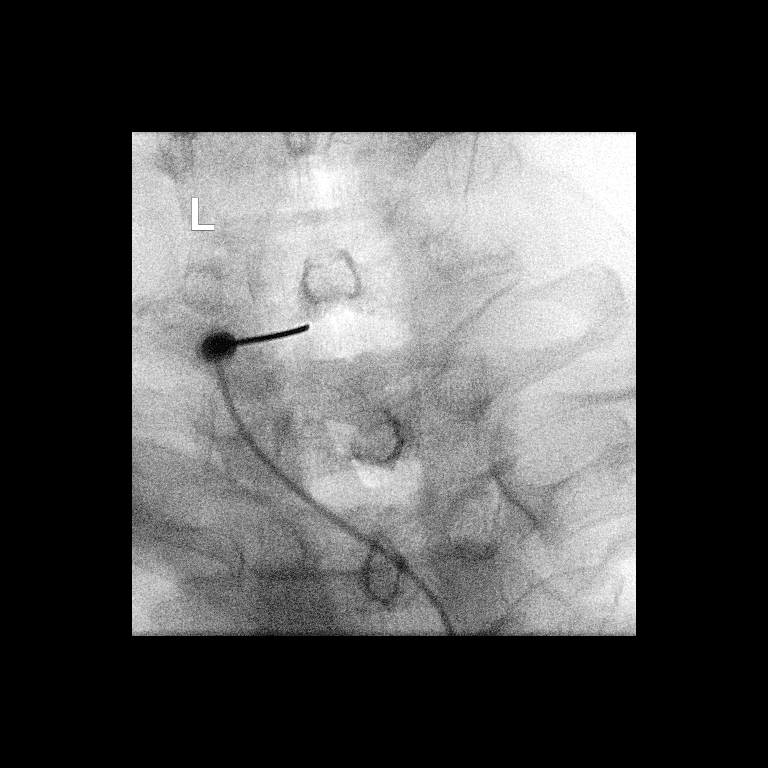

[2 of 2 positions shown; findings below may reference images not displayed]

FLUOROSCOPY TIME:  0 minutes 24 seconds. 11.67 micro gray meter
squared

PROCEDURE:
CERVICAL EPIDURAL INJECTION

An interlaminar approach was performed on the left at C7-T1 . A 20
gauge epidural needle was advanced using loss-of-resistance
technique.

DIAGNOSTIC EPIDURAL INJECTION

Injection of Isovue-M 300 shows a good epidural pattern with spread
above and below the level of needle placement, primarily on the
left. No vascular opacification is seen. THERAPEUTIC

EPIDURAL INJECTION

1.5 ml of Kenalog 40 mixed with 1 ml of 1% Lidocaine and 2 ml of
normal saline were then instilled. The procedure was well-tolerated,
and the patient was discharged thirty minutes following the
injection in good condition.
IMPRESSION: Technically successful second epidural injection on the left at
C7-T1.

## 2019-08-27 DIAGNOSIS — Z20828 Contact with and (suspected) exposure to other viral communicable diseases: Secondary | ICD-10-CM | POA: Diagnosis not present

## 2019-08-27 DIAGNOSIS — J40 Bronchitis, not specified as acute or chronic: Secondary | ICD-10-CM | POA: Diagnosis not present

## 2019-08-27 DIAGNOSIS — J069 Acute upper respiratory infection, unspecified: Secondary | ICD-10-CM | POA: Diagnosis not present

## 2019-09-11 DIAGNOSIS — J019 Acute sinusitis, unspecified: Secondary | ICD-10-CM | POA: Diagnosis not present

## 2019-10-02 DIAGNOSIS — Z23 Encounter for immunization: Secondary | ICD-10-CM | POA: Diagnosis not present

## 2019-10-23 DIAGNOSIS — Z23 Encounter for immunization: Secondary | ICD-10-CM | POA: Diagnosis not present

## 2019-12-30 DIAGNOSIS — Z20828 Contact with and (suspected) exposure to other viral communicable diseases: Secondary | ICD-10-CM | POA: Diagnosis not present

## 2020-12-26 ENCOUNTER — Other Ambulatory Visit (HOSPITAL_COMMUNITY): Payer: Self-pay | Admitting: Orthopaedic Surgery

## 2020-12-26 DIAGNOSIS — M5412 Radiculopathy, cervical region: Secondary | ICD-10-CM

## 2020-12-27 ENCOUNTER — Ambulatory Visit (HOSPITAL_COMMUNITY)
Admission: RE | Admit: 2020-12-27 | Discharge: 2020-12-27 | Disposition: A | Discharge status: Home / Self Care | Payer: BLUE CROSS/BLUE SHIELD | Source: Ambulatory Visit | Attending: Orthopaedic Surgery | Admitting: Orthopaedic Surgery

## 2020-12-27 ENCOUNTER — Other Ambulatory Visit: Payer: Self-pay

## 2020-12-27 DIAGNOSIS — M5412 Radiculopathy, cervical region: Secondary | ICD-10-CM | POA: Diagnosis not present

## 2020-12-30 ENCOUNTER — Emergency Department (HOSPITAL_COMMUNITY)
Admission: EM | Admit: 2020-12-30 | Discharge: 2020-12-31 | Disposition: A | Discharge status: Home / Self Care | Payer: BLUE CROSS/BLUE SHIELD | Attending: Emergency Medicine | Admitting: Emergency Medicine

## 2020-12-30 ENCOUNTER — Emergency Department (HOSPITAL_COMMUNITY): Payer: BLUE CROSS/BLUE SHIELD

## 2020-12-30 ENCOUNTER — Other Ambulatory Visit: Payer: Self-pay

## 2020-12-30 DIAGNOSIS — R600 Localized edema: Secondary | ICD-10-CM | POA: Insufficient documentation

## 2020-12-30 DIAGNOSIS — M7989 Other specified soft tissue disorders: Secondary | ICD-10-CM | POA: Diagnosis not present

## 2020-12-30 DIAGNOSIS — R110 Nausea: Secondary | ICD-10-CM | POA: Insufficient documentation

## 2020-12-30 DIAGNOSIS — M542 Cervicalgia: Secondary | ICD-10-CM | POA: Diagnosis present

## 2020-12-30 DIAGNOSIS — M79621 Pain in right upper arm: Secondary | ICD-10-CM | POA: Insufficient documentation

## 2020-12-30 DIAGNOSIS — M79601 Pain in right arm: Secondary | ICD-10-CM

## 2020-12-30 LAB — BRAIN NATRIURETIC PEPTIDE: B Natriuretic Peptide: 186 pg/mL — ABNORMAL HIGH (ref 0.0–100.0)

## 2020-12-30 LAB — CBC WITH DIFFERENTIAL/PLATELET
Abs Immature Granulocytes: 0.01 10*3/uL (ref 0.00–0.07)
Basophils Absolute: 0 10*3/uL (ref 0.0–0.1)
Basophils Relative: 1 %
Eosinophils Absolute: 0.2 10*3/uL (ref 0.0–0.5)
Eosinophils Relative: 4 %
HCT: 38.7 % — ABNORMAL LOW (ref 39.0–52.0)
Hemoglobin: 13 g/dL (ref 13.0–17.0)
Immature Granulocytes: 0 %
Lymphocytes Relative: 26 %
Lymphs Abs: 1.2 10*3/uL (ref 0.7–4.0)
MCH: 30.9 pg (ref 26.0–34.0)
MCHC: 33.6 g/dL (ref 30.0–36.0)
MCV: 91.9 fL (ref 80.0–100.0)
Monocytes Absolute: 0.5 10*3/uL (ref 0.1–1.0)
Monocytes Relative: 11 %
Neutro Abs: 2.7 10*3/uL (ref 1.7–7.7)
Neutrophils Relative %: 58 %
Platelets: 168 10*3/uL (ref 150–400)
RBC: 4.21 MIL/uL — ABNORMAL LOW (ref 4.22–5.81)
RDW: 12.6 % (ref 11.5–15.5)
WBC: 4.7 10*3/uL (ref 4.0–10.5)
nRBC: 0 % (ref 0.0–0.2)

## 2020-12-30 LAB — COMPREHENSIVE METABOLIC PANEL
ALT: 162 U/L — ABNORMAL HIGH (ref 0–44)
AST: 144 U/L — ABNORMAL HIGH (ref 15–41)
Albumin: 4 g/dL (ref 3.5–5.0)
Alkaline Phosphatase: 79 U/L (ref 38–126)
Anion gap: 6 (ref 5–15)
BUN: 20 mg/dL (ref 8–23)
CO2: 27 mmol/L (ref 22–32)
Calcium: 9 mg/dL (ref 8.9–10.3)
Chloride: 108 mmol/L (ref 98–111)
Creatinine, Ser: 0.8 mg/dL (ref 0.61–1.24)
GFR, Estimated: >60 mL/min (ref >60)
Glucose, Bld: 111 mg/dL — ABNORMAL HIGH (ref 70–99)
Potassium: 4 mmol/L (ref 3.5–5.1)
Sodium: 141 mmol/L (ref 135–145)
Total Bilirubin: 0.8 mg/dL (ref 0.3–1.2)
Total Protein: 6.6 g/dL (ref 6.5–8.1)

## 2020-12-30 LAB — TROPONIN I (HIGH SENSITIVITY): Troponin I (High Sensitivity): 5 ng/L (ref <18)

## 2020-12-30 NOTE — ED Triage Notes (Signed)
Last Oxycodone 1830. Last Ibuprofen 2100. Pt is in significant pain in triage. Pt is currently experiencing numbness and tingling in right arm which is intermittent over last week.

## 2020-12-30 NOTE — ED Triage Notes (Addendum)
Nausea and bilateral leg swelling x 1 day. Pt started oxycodone 3 days ago. Has been taking Ibuprofen 800mg  for one month. 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

## 2020-12-31 DIAGNOSIS — M542 Cervicalgia: Secondary | ICD-10-CM | POA: Diagnosis not present

## 2020-12-31 LAB — TROPONIN I (HIGH SENSITIVITY): Troponin I (High Sensitivity): 5 ng/L (ref <18)

## 2020-12-31 MED ORDER — KETOROLAC TROMETHAMINE 60 MG/2ML IM SOLN
60.0000 mg | Freq: Once | INTRAMUSCULAR | Status: DC
  Filled 2020-12-31: qty 2

## 2020-12-31 MED ORDER — PREGABALIN 50 MG PO CAPS
50.0000 mg | ORAL_CAPSULE | Freq: Once | ORAL | Status: DC
  Filled 2020-12-31: qty 1

## 2020-12-31 MED ORDER — DIAZEPAM 5 MG PO TABS
5.0000 mg | ORAL_TABLET | Freq: Once | ORAL | Status: DC
  Filled 2020-12-31: qty 1

## 2020-12-31 MED ORDER — DIAZEPAM 5 MG PO TABS: 5.0000 mg | ORAL_TABLET | Freq: Four times a day (QID) | ORAL | 0 refills | Status: AC | PRN

## 2020-12-31 MED ORDER — DIAZEPAM 5 MG PO TABS
5.0000 mg | ORAL_TABLET | Freq: Once | ORAL | Status: AC
  Administered 2020-12-31: 5 mg via ORAL
  Filled 2020-12-31: qty 1

## 2020-12-31 MED ORDER — DIAZEPAM 5 MG PO TABS: 5.0000 mg | ORAL_TABLET | Freq: Four times a day (QID) | ORAL | 0 refills | Status: DC | PRN

## 2020-12-31 NOTE — ED Notes (Signed)
Pt standing in room. Refused medications.

## 2020-12-31 NOTE — ED Provider Notes (Signed)
[facility] EMERGENCY DEPARTMENT Provider Note   CSN: [id number] Arrival date & time: 12/30/20  2227     History Chief Complaint  Patient presents with   Nausea   Leg Swelling    Calvin Delacruz is a 60 y.o. male.  Patient is here for couple different symptoms.  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  He states that he had an MRI done recently and is started on some Vicodin.  Over the last 24 to 36 hours has had progressively worsening lower extremity swelling associated some nausea.  States that he was on tramadol and Neurontin at some time 4 years ago and developed some liver injury with that.  He states he is concerned that was going on again this time.  Has not been taking tramadol or Neurontin.  Has been taken Motrin along with Tylenol when he is not taking the Vicodin.  He does have some lower extremity swelling because of it.  States last time they did not do any intervention.  His ultrasound on review of records was okay.  He saw Dr. Dionicia Abler who just encouraged fluids and told him to come back for reevaluation and his labs improved.  Hepatitis labs at time are negative.       Past Medical History:  Diagnosis Date   GERD (gastroesophageal reflux disease)     Patient Active Problem List   Diagnosis Date Noted   Dysphagia 10/27/2015    Past Surgical History:  Procedure Laterality Date   ESOPHAGEAL DILATION N/A 10/27/2015   Procedure: ESOPHAGEAL DILATION;  Surgeon: Malissa Hippo, MD;  Location: [facility];  Service: Endoscopy;  Laterality: N/A;   ESOPHAGOGASTRODUODENOSCOPY N/A 10/27/2015   Procedure: ESOPHAGOGASTRODUODENOSCOPY (EGD);  Surgeon: Malissa Hippo, MD;  Location: [facility];  Service: Endoscopy;  Laterality: N/A;   FINGER SURGERY Right    and arm   FOREIGN BODY REMOVAL N/A 10/27/2015   Procedure: FOREIGN BODY REMOVAL;  Surgeon: Malissa Hippo, MD;  Location: [facility];  Service: Endoscopy;  Laterality: N/A;       Family History   Problem Relation Age of Onset   GER disease Mother    Hypertension Mother    Heart attack Father        Died age 39   GER disease Sister    Hypertension Sister    GER disease Brother    CAD Brother     Social History   Tobacco Use   Smoking status: Never   Smokeless tobacco: Never   Tobacco comments:    as teenager only and not much  Substance Use Topics   Alcohol use: Yes    Comment: Occasionally   Drug use: No    Home Medications Prior to Admission medications   Medication Sig Start Date End Date Taking? Authorizing Provider  diazepam (VALIUM) 5 MG tablet Take 1 tablet (5 mg total) by mouth every 6 (six) hours as needed (spasms). 12/31/20   Deric Bocock, Barbara Cower, MD  esomeprazole (NEXIUM) 20 MG capsule Take 20 mg by mouth daily at 12 noon.    [provider]    Allergies    Patient has no known allergies.  Review of Systems   Review of Systems  All other systems reviewed and are negative.  Physical Exam Updated Vital Signs BP (!) 163/102 Comment: standing/ moving arm  Pulse (!) 52   Temp 98.1 F (36.7 C)   Resp 14   Ht 5\' 7"  (1.702  m)   Wt 79.4 kg   SpO2 99%   BMI 27.41 kg/m   Physical Exam Vitals and nursing note reviewed.  Constitutional:      Appearance: He is well-developed.  HENT:     Head: Normocephalic and atraumatic.     Nose: No congestion or rhinorrhea.     Mouth/Throat:     Mouth: Mucous membranes are moist.     Pharynx: Oropharynx is clear.  Eyes:     Pupils: Pupils are equal, round, and reactive to light.  Cardiovascular:     Rate and Rhythm: Normal rate.  Pulmonary:     Effort: Pulmonary effort is normal. No respiratory distress.  Abdominal:     General: There is no distension.  Musculoskeletal:        General: Tenderness (Tenderness over the right trapezius muscle which also feels like a spasm that is also is holding it in a protective manner) present. Normal range of motion.     Cervical back: Normal range of motion.      Right lower leg: Edema present.     Left lower leg: Edema present.  Skin:    General: Skin is warm and dry.  Neurological:     General: No focal deficit present.     Mental Status: He is alert.     Motor: Motor function is intact.     Coordination: Coordination is intact.    ED Results / Procedures / Treatments   Labs (all labs ordered are listed, but only abnormal results are displayed) Labs Reviewed  CBC WITH DIFFERENTIAL/PLATELET - Abnormal; Notable for the following components:      Result Value   RBC 4.21 (*)    HCT 38.7 (*)    All other components within normal limits  COMPREHENSIVE METABOLIC PANEL - Abnormal; Notable for the following components:   Glucose, Bld 111 (*)    AST 144 (*)    ALT 162 (*)    All other components within normal limits  BRAIN NATRIURETIC PEPTIDE - Abnormal; Notable for the following components:   B Natriuretic Peptide 186.0 (*)    All other components within normal limits  URINALYSIS, ROUTINE W REFLEX MICROSCOPIC  TROPONIN I (HIGH SENSITIVITY)  TROPONIN I (HIGH SENSITIVITY)    EKG None  Radiology DG Chest Portable 1 View  Result Date: 12/30/2020 CLINICAL DATA:  Bilateral lower extremity swelling and nausea. EXAM: PORTABLE CHEST 1 VIEW COMPARISON:  None. FINDINGS: Mild central vascular congestion. No focal consolidation, pleural effusion, or pneumothorax. Borderline cardiomegaly. No acute osseous pathology. IMPRESSION: Mild central vascular congestion. Electronically Signed   By: Elgie Collard M.D.   On: 12/30/2020 23:42    Procedures Procedures   Medications Ordered in ED Medications  ketorolac (TORADOL) injection 60 mg (60 mg Intramuscular Patient Refused/Not Given 12/31/20 0131)  pregabalin (LYRICA) capsule 50 mg (50 mg Oral Patient Refused/Not Given 12/31/20 0132)  diazepam (VALIUM) tablet 5 mg (5 mg Oral Patient Refused/Not Given 12/31/20 0131)  diazepam (VALIUM) tablet 5 mg (5 mg Oral Given 12/31/20 0331)    ED Course  I  have reviewed the triage vital signs and the nursing notes.  Pertinent labs & imaging results that were available during my care of the patient were reviewed by me and considered in my medical decision making (see chart for details).    MDM Rules/Calculators/A&P                         Patient  likely has some radiculopathic pain but he is very concerned about trying new medications. Medication that is similar between the 2 events is his tramadol and hydrocodone which were both opiates.  I talked to the pharmacist and apparently this can in rare cases cause some liver damage.  I would suggest not doing this.  I talked to him about continuing anti-inflammatories, muscle relaxer and trying Lyrica as he was on Neurontin last time his liver issue happened.  He is very hesitant.  He wants to follow-up with Dr. Ninfa Linden for his liver issues and wants to just continue with the Tylenol and ibuprofen at home if I can give him an nerve block.  We will try to treat his outpatient doctors for the same.  Patient without any acute neurologic deficit requiring emergent surgery or emergent neurosurgical consultation.  Final Clinical Impression(s) / ED Diagnoses Final diagnoses:  Neck pain  Pain of right upper extremity    Rx / DC Orders ED Discharge Orders          Ordered    diazepam (VALIUM) 5 MG tablet  Every 6 hours PRN,   Status:  Discontinued        12/31/20 0324    diazepam (VALIUM) 5 MG tablet  Every 6 hours PRN        12/31/20 0328             Rilei Kravitz, Barbara Cower, MD 12/31/20 [phone]

## 2020-12-31 NOTE — ED Notes (Signed)
Gave pt urinal
